# Patient Record
Sex: Female | Born: 2000 | Race: Black or African American | Hispanic: No | Marital: Single
Health system: Southern US, Community
[De-identification: ages and names within clinical notes are randomized; demographics above are authoritative.]

---
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
## 2018-12-10 ENCOUNTER — Encounter (HOSPITAL_COMMUNITY): Payer: Self-pay

## 2018-12-10 ENCOUNTER — Emergency Department (HOSPITAL_COMMUNITY)
Admission: EM | Admit: 2018-12-10 | Discharge: 2018-12-10 | Disposition: A | Discharge status: Home / Self Care | Payer: Medicaid Other | Attending: Emergency Medicine | Admitting: Emergency Medicine

## 2018-12-10 ENCOUNTER — Other Ambulatory Visit: Payer: Self-pay

## 2018-12-10 DIAGNOSIS — M791 Myalgia, unspecified site: Secondary | ICD-10-CM | POA: Diagnosis present

## 2018-12-10 DIAGNOSIS — J111 Influenza due to unidentified influenza virus with other respiratory manifestations: Secondary | ICD-10-CM | POA: Diagnosis not present

## 2018-12-10 DIAGNOSIS — Z7722 Contact with and (suspected) exposure to environmental tobacco smoke (acute) (chronic): Secondary | ICD-10-CM | POA: Insufficient documentation

## 2018-12-10 DIAGNOSIS — R6889 Other general symptoms and signs: Secondary | ICD-10-CM

## 2018-12-10 MED ORDER — ACETAMINOPHEN 500 MG PO TABS
1000.0000 mg | ORAL_TABLET | Freq: Once | ORAL | Status: AC
  Administered 2018-12-10: 1000 mg via ORAL
  Filled 2018-12-10: qty 2

## 2018-12-10 MED ORDER — ONDANSETRON 4 MG PO TBDP: 4.0000 mg | ORAL_TABLET | Freq: Three times a day (TID) | ORAL | 0 refills | Status: AC | PRN

## 2018-12-10 MED ORDER — BENZONATATE 100 MG PO CAPS: 100.0000 mg | ORAL_CAPSULE | Freq: Three times a day (TID) | ORAL | 0 refills | Status: AC | PRN

## 2018-12-10 NOTE — Discharge Instructions (Addendum)
Please take Ibuprofen (Advil, motrin) and Tylenol (acetaminophen) to relieve your pain.  You may take up to 600 MG (3 pills) of normal strength ibuprofen every 8 hours as needed.  In between doses of ibuprofen you make take tylenol, up to 1,000 mg (two extra strength pills).  Do not take more than 3,000 mg tylenol in a 24 hour period.  Please check all medication labels as many medications such as pain and cold medications may contain tylenol.  Do not drink alcohol while taking these medications.  Do not take other NSAID'S while taking ibuprofen (such as aleve or naproxen).  Please take ibuprofen with food to decrease stomach upset. ° °Today your symptoms are consistent with a flu or a flulike illness.  As I did not test you for the flu we call this a flulike illness.  I have given you information to read on the flu as most of this still applies.  If you have not already, please consider getting a flu shot once you are well.  Please make sure to practice good hand hygiene to help prevent the spread of flu.  We discussed today that many symptoms of the flu such as fever, not feeling well, and body aches can also be the first signs of more serious illnesses or infections.  If you have any new or worsening symptoms please seek additional medical care and evaluation.   ° °

## 2018-12-10 NOTE — ED Provider Notes (Signed)
[facility]-EMERGENCY DEPT Provider Note   CSN: [id number] Arrival date & time: 12/10/18  1118     History   Chief Complaint Chief Complaint  Patient presents with  . Generalized Body Aches    HPI Lindsay David is a 17 y.o. female who presents today for evaluation of symptoms since yesterday morning.  Her symptoms include body aches, nausea, vomiting, hot and cold flashes.  Her throat hurts.  She has a slight dry cough. She has tried tylenol PM with out significant relief.    She denies any dysuria, hematuria, increased frequency or urgency.  No pelvic pain or abnormal vaginal discharge or bleeding.  He denies any possibility of pregnancy.  Her abdomen does not hurt anymore than the rest of her body does.  No chest pain or shortness of breath.  She is in the [employer] and therefore believes she was given a flu shot this year.    HPI  History reviewed. No pertinent past medical history.  There are no active problems to display for this patient.   History reviewed. No pertinent surgical history.   OB History   No obstetric history on file.      Home Medications    Prior to Admission medications   Medication Sig Start Date End Date Taking? Authorizing Provider  acetaminophen (TYLENOL) 500 MG tablet Take 1 tablet (500 mg total) by mouth every 6 (six) hours as needed. 09/28/17   Marlon PelGreene, Tiffany, PA-C  benzonatate (TESSALON) 100 MG capsule Take 1 capsule (100 mg total) by mouth every 8 (eight) hours as needed for cough. 12/10/18   Cristina GongHammond, Okey Zelek W, PA-C  dextromethorphan-guaiFENesin [facility](MUCINEX DM) 30-600 MG 12hr tablet Take 1 tablet by mouth 2 (two) times daily. 09/28/17   Marlon PelGreene, Tiffany, PA-C  naproxen (NAPROSYN) 250 MG tablet Take 1 tablet (250 mg total) by mouth 2 (two) times daily with a meal. Patient not taking: Reported on 05/04/2017 11/29/16   Everlene Farrieransie, William, PA-C  ondansetron (ZOFRAN ODT) 4 MG disintegrating tablet Take 1 tablet (4 mg total)  by mouth every 8 (eight) hours as needed for nausea or vomiting. 12/10/18   Cristina GongHammond, Stillman Buenger W, PA-C  polyethylene glycol powder (GLYCOLAX/MIRALAX) powder Take 17 g by mouth daily. Patient not taking: Reported on 12/16/2015 10/14/13   Lowanda FosterBrewer, Mindy, NP    Family History History reviewed. No pertinent family history.  Social History Social History   Tobacco Use  . Smoking status: Passive Smoke Exposure - Never Smoker  . Smokeless tobacco: Never Used  Substance Use Topics  . Alcohol use: No  . Drug use: No    Types: Marijuana    Comment: "I stopped"     Allergies   Patient has no known allergies.   Review of Systems Review of Systems  Constitutional: Positive for chills, fatigue and fever.  HENT: Positive for congestion, postnasal drip, rhinorrhea and sore throat. Negative for ear pain, sinus pressure, sinus pain and trouble swallowing.   Eyes: Negative for visual disturbance.  Respiratory: Positive for cough. Negative for chest tightness and shortness of breath.   Cardiovascular: Positive for chest pain (Occasional, after coughing started).  Gastrointestinal: Positive for nausea and vomiting. Negative for abdominal pain and diarrhea.       Nausea and vomiting have resolved.  Genitourinary: Negative for dysuria.  Musculoskeletal: Positive for back pain. Negative for neck pain.  Skin: Negative for rash.  Neurological: Negative for weakness and headaches.  Psychiatric/Behavioral: Negative for confusion.  All other systems reviewed and  are negative.    Physical Exam Updated Vital Signs BP 140/82 (BP Location: Left Arm)   Pulse (!) 101   Temp (!) 101.3 F (38.5 C) (Oral)   Resp 16   Ht 5' 4.5" (1.638 m)   Wt 63.5 kg   SpO2 100%   BMI 23.66 kg/m   Physical Exam Vitals signs and nursing note reviewed.  Constitutional:      General: She is not in acute distress.    Appearance: She is well-developed. She is not diaphoretic.  HENT:     Head: Normocephalic and  atraumatic.     Right Ear: Tympanic membrane, ear canal and external ear normal.     Left Ear: Tympanic membrane, ear canal and external ear normal.     Nose: Mucosal edema, congestion and rhinorrhea present.     Mouth/Throat:     Mouth: Mucous membranes are moist.     Pharynx: Uvula midline. No oropharyngeal exudate.     Tonsils: No tonsillar exudate.  Eyes:     General: No scleral icterus.       Right eye: No discharge.        Left eye: No discharge.     Conjunctiva/sclera: Conjunctivae normal.  Neck:     Musculoskeletal: Normal range of motion and neck supple.  Cardiovascular:     Rate and Rhythm: Regular rhythm.     Heart sounds: Normal heart sounds.     Comments: Borderline tachycardia while febrile Pulmonary:     Effort: Pulmonary effort is normal. No respiratory distress.     Breath sounds: Normal breath sounds. No wheezing.  Abdominal:     General: Abdomen is flat. There is no distension.     Tenderness: There is no abdominal tenderness. There is no guarding or rebound.  Musculoskeletal:     Comments: Diffuse pain over entire back bilaterally with palpation.  There is no localized tenderness to palpation on back, neck, arms or legs.  Lymphadenopathy:     Cervical: No cervical adenopathy.  Skin:    General: Skin is warm and dry.  Neurological:     General: No focal deficit present.     Mental Status: She is alert.  Psychiatric:        Behavior: Behavior normal.      ED Treatments / Results  Labs (all labs ordered are listed, but only abnormal results are displayed) Labs Reviewed - No data to display  EKG None  Radiology No results found.  Procedures Procedures (including critical care time)  Medications Ordered in ED Medications  acetaminophen (TYLENOL) tablet 1,000 mg (1,000 mg Oral Given 12/10/18 1619)     Initial Impression / Assessment and Plan / ED Course  I have reviewed the triage vital signs and the nursing notes.  Pertinent labs & imaging  results that were available during my care of the patient were reviewed by me and considered in my medical decision making (see chart for details).  Clinical Course as of Dec 10 2238  Mon Dec 10, 2018  1618 In room patient's heart rate is 100.4, her temperature is approximately 101.3.  Tylenol ordered.   [EH]    Clinical Course User Index [EH] Cristina GongHammond, Tyshaun Vinzant W, PA-C   Patient with symptoms consistent with influenza.  Vitals are stable, low-grade fever.  No signs of dehydration, tolerating PO's.  Lungs are clear. Due to patient's presentation and physical exam a chest x-ray was not ordered bc likely diagnosis of flu.  Discussed the cost versus  benefit of Tamiflu treatment with the patient who declined.   She does not have any abdominal pain with palpation.  Her entire back is equally tender with palpation.      Patient will be discharged with instructions to orally hydrate, rest, and use over-the-counter medications such as anti-inflammatories ibuprofen and Aleve for muscle aches and Tylenol for fever.  Patient will also be given a cough suppressant.  She is given Zofran for nausea and vomiting, however she states that that has resolved.  Zofran given to ensure that she is able to remain adequately orally hydrated at home.  Return precautions were discussed with patient who states their understanding.  At the time of discharge patient denied any unaddressed complaints or concerns.  Patient is agreeable for discharge home.   Final Clinical Impressions(s) / ED Diagnoses   Final diagnoses:  Flu-like symptoms    ED Discharge Orders         Ordered    ondansetron (ZOFRAN ODT) 4 MG disintegrating tablet  Every 8 hours PRN     12/10/18 1621    benzonatate (TESSALON) 100 MG capsule  Every 8 hours PRN     12/10/18 1621           Cristina Gong, PA-C 12/10/18 2241    Doug Sou, MD 12/11/18 [phone]

## 2018-12-10 NOTE — ED Triage Notes (Signed)
Pt reports generalized body aches x2 days. Pt reports she thinks she has the flu.

## 2018-12-17 ENCOUNTER — Emergency Department (HOSPITAL_COMMUNITY): Payer: No Typology Code available for payment source

## 2018-12-17 ENCOUNTER — Encounter (HOSPITAL_COMMUNITY): Payer: Self-pay | Admitting: Emergency Medicine

## 2018-12-17 ENCOUNTER — Emergency Department (HOSPITAL_COMMUNITY)
Admission: EM | Admit: 2018-12-17 | Discharge: 2018-12-17 | Disposition: A | Discharge status: Home / Self Care | Payer: No Typology Code available for payment source | Attending: Emergency Medicine | Admitting: Emergency Medicine

## 2018-12-17 DIAGNOSIS — S39012A Strain of muscle, fascia and tendon of lower back, initial encounter: Secondary | ICD-10-CM | POA: Diagnosis not present

## 2018-12-17 DIAGNOSIS — Y999 Unspecified external cause status: Secondary | ICD-10-CM | POA: Insufficient documentation

## 2018-12-17 DIAGNOSIS — Y929 Unspecified place or not applicable: Secondary | ICD-10-CM | POA: Insufficient documentation

## 2018-12-17 DIAGNOSIS — S161XXA Strain of muscle, fascia and tendon at neck level, initial encounter: Secondary | ICD-10-CM | POA: Diagnosis not present

## 2018-12-17 DIAGNOSIS — Y939 Activity, unspecified: Secondary | ICD-10-CM | POA: Diagnosis not present

## 2018-12-17 MED ORDER — IBUPROFEN 800 MG PO TABS: 800.0000 mg | ORAL_TABLET | Freq: Three times a day (TID) | ORAL | 0 refills | Status: AC | PRN

## 2018-12-17 MED ORDER — TRAMADOL HCL 50 MG PO TABS: 50.0000 mg | ORAL_TABLET | Freq: Four times a day (QID) | ORAL | 0 refills | Status: AC | PRN
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
## 2018-12-17 NOTE — Discharge Instructions (Addendum)
Return here as needed.  Your x-rays did not show any abnormalities.  Use ice and heat on the areas that are sore.  You can expect to be more sore over the next 10 to 14 days.
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
## 2018-12-17 NOTE — ED Notes (Signed)
Patient transported to X-ray 
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
